# Patient Record
Sex: Male | Born: 1955
Health system: Southern US, Community
[De-identification: ages and names within clinical notes are randomized; demographics above are authoritative.]

---

## 2019-06-05 ENCOUNTER — Other Ambulatory Visit (HOSPITAL_COMMUNITY): Payer: Self-pay

## 2019-06-05 ENCOUNTER — Inpatient Hospital Stay
Admission: RE | Admit: 2019-06-05 | Discharge: 2019-06-17 | Disposition: A | Discharge status: Rehabilitation Facility | Payer: Self-pay | Source: Other Acute Inpatient Hospital | Attending: Internal Medicine | Admitting: Internal Medicine

## 2019-06-05 DIAGNOSIS — K9423 Gastrostomy malfunction: Secondary | ICD-10-CM

## 2019-06-05 DIAGNOSIS — Z931 Gastrostomy status: Secondary | ICD-10-CM

## 2019-06-05 MED ORDER — IOHEXOL 300 MG/ML  SOLN
50.0000 mL | Freq: Once | INTRAMUSCULAR | Status: AC | PRN
  Administered 2019-06-05: 50 mL via ORAL

## 2019-06-06 LAB — COMPREHENSIVE METABOLIC PANEL
ALT: 83 U/L — ABNORMAL HIGH (ref 0–44)
AST: 84 U/L — ABNORMAL HIGH (ref 15–41)
Albumin: 2.1 g/dL — ABNORMAL LOW (ref 3.5–5.0)
Alkaline Phosphatase: 173 U/L — ABNORMAL HIGH (ref 38–126)
Anion gap: 7 (ref 5–15)
BUN: 34 mg/dL — ABNORMAL HIGH (ref 8–23)
CO2: 29 mmol/L (ref 22–32)
Calcium: 8.4 mg/dL — ABNORMAL LOW (ref 8.9–10.3)
Chloride: 109 mmol/L (ref 98–111)
Creatinine, Ser: 2.08 mg/dL — ABNORMAL HIGH (ref 0.61–1.24)
GFR calc Af Amer: 38 mL/min — ABNORMAL LOW (ref >60)
GFR calc non Af Amer: 32 mL/min — ABNORMAL LOW (ref >60)
Glucose, Bld: 138 mg/dL — ABNORMAL HIGH (ref 70–99)
Potassium: 4.5 mmol/L (ref 3.5–5.1)
Sodium: 145 mmol/L (ref 135–145)
Total Bilirubin: 0.6 mg/dL (ref 0.3–1.2)
Total Protein: 6.1 g/dL — ABNORMAL LOW (ref 6.5–8.1)

## 2019-06-06 LAB — CBC
HCT: 39.1 % (ref 39.0–52.0)
Hemoglobin: 12.2 g/dL — ABNORMAL LOW (ref 13.0–17.0)
MCH: 28.6 pg (ref 26.0–34.0)
MCHC: 31.2 g/dL (ref 30.0–36.0)
MCV: 91.8 fL (ref 80.0–100.0)
Platelets: 173 10*3/uL (ref 150–400)
RBC: 4.26 MIL/uL (ref 4.22–5.81)
RDW: 14 % (ref 11.5–15.5)
WBC: 9.3 10*3/uL (ref 4.0–10.5)
nRBC: 0 % (ref 0.0–0.2)

## 2019-06-06 LAB — MAGNESIUM: Magnesium: 2.5 mg/dL — ABNORMAL HIGH (ref 1.7–2.4)

## 2019-06-06 MED ORDER — ALBA-3 0.02-0.1-1 % OT LIQD
OTIC | Status: DC
Start: ? — End: 2019-06-06

## 2019-06-06 MED ORDER — GENERIC EXTERNAL MEDICATION
5.00 | Status: DC
Start: ? — End: 2019-06-06

## 2019-06-06 MED ORDER — BISACODYL 10 MG RE SUPP
10.00 | RECTAL | Status: DC
Start: ? — End: 2019-06-06

## 2019-06-06 MED ORDER — MELATONIN 3 MG PO TABS
6.00 | ORAL_TABLET | ORAL | Status: DC
Start: 2019-06-05 — End: 2019-06-06

## 2019-06-06 MED ORDER — GENERIC EXTERNAL MEDICATION
Status: DC
Start: 2019-06-05 — End: 2019-06-06

## 2019-06-06 MED ORDER — GENERIC EXTERNAL MEDICATION
137.00 | Status: DC
Start: 2019-06-06 — End: 2019-06-06

## 2019-06-06 MED ORDER — GENERIC EXTERNAL MEDICATION
30.00 | Status: DC
Start: 2019-06-05 — End: 2019-06-06

## 2019-06-06 MED ORDER — HYDROMORPHONE HCL 1 MG/ML IJ SOLN
1.00 | INTRAMUSCULAR | Status: DC
Start: ? — End: 2019-06-06

## 2019-06-06 MED ORDER — ALLOPURINOL 300 MG PO TABS
300.00 | ORAL_TABLET | ORAL | Status: DC
Start: 2019-06-06 — End: 2019-06-06

## 2019-06-06 MED ORDER — AMLODIPINE BESYLATE 5 MG PO TABS
5.00 | ORAL_TABLET | ORAL | Status: DC
Start: 2019-06-06 — End: 2019-06-06

## 2019-06-06 MED ORDER — ALBUTEROL SULFATE (2.5 MG/3ML) 0.083% IN NEBU
2.50 | INHALATION_SOLUTION | RESPIRATORY_TRACT | Status: DC
Start: ? — End: 2019-06-06

## 2019-06-06 MED ORDER — Medication
600.00 | Status: DC
Start: ? — End: 2019-06-06

## 2019-06-06 MED ORDER — SENNOSIDES-DOCUSATE SODIUM 8.6-50 MG PO TABS
1.00 | ORAL_TABLET | ORAL | Status: DC
Start: 2019-06-05 — End: 2019-06-06

## 2019-06-06 MED ORDER — INFANTS PANADOL PO
5.00 | ORAL | Status: DC
Start: ? — End: 2019-06-06

## 2019-06-06 MED ORDER — METOPROLOL TARTRATE 50 MG PO TABS
50.00 | ORAL_TABLET | ORAL | Status: DC
Start: 2019-06-05 — End: 2019-06-06

## 2019-06-06 MED ORDER — HEPARIN SODIUM (PORCINE) 5000 UNIT/ML IJ SOLN
5000.00 | INTRAMUSCULAR | Status: DC
Start: 2019-06-05 — End: 2019-06-06

## 2019-06-06 MED ORDER — YALE REUSABLE NEEDLE 25G X 1" MISC
50.00 | Status: DC
Start: 2019-06-05 — End: 2019-06-06

## 2019-06-06 MED ORDER — EQL PEDIATRIC ELECTROLYTE PO SOLN
0.40 | ORAL | Status: DC
Start: 2019-06-06 — End: 2019-06-06

## 2019-06-06 MED ORDER — HYDROXYZINE HCL 25 MG PO TABS
25.00 | ORAL_TABLET | ORAL | Status: DC
Start: ? — End: 2019-06-06

## 2019-06-06 MED ORDER — CALCIUM-MAGNESIUM-VITAMIN D PO
0.50 | ORAL | Status: DC
Start: ? — End: 2019-06-06

## 2019-06-06 MED ORDER — LOSARTAN POTASSIUM 25 MG PO TABS
25.00 | ORAL_TABLET | ORAL | Status: DC
Start: 2019-06-06 — End: 2019-06-06

## 2019-06-06 MED ORDER — PHENYLEPHRINE-GUAIFENESIN 20-375 MG PO CP12
10.00 | ORAL_CAPSULE | ORAL | Status: DC
Start: ? — End: 2019-06-06

## 2019-06-06 MED ORDER — POLYETHYLENE GLYCOL 3350 17 G PO PACK
17.00 | PACK | ORAL | Status: DC
Start: 2019-06-06 — End: 2019-06-06

## 2019-06-06 MED ORDER — QUINERVA 260 MG PO TABS
650.00 | ORAL_TABLET | ORAL | Status: DC
Start: ? — End: 2019-06-06

## 2019-06-06 MED ORDER — PRO HERBS ENERGY PO TABS
20.00 | ORAL_TABLET | ORAL | Status: DC
Start: ? — End: 2019-06-06

## 2019-06-06 MED ORDER — ATORVASTATIN CALCIUM 40 MG PO TABS
80.00 | ORAL_TABLET | ORAL | Status: DC
Start: 2019-06-06 — End: 2019-06-06

## 2019-06-06 MED ORDER — CYSTEINE POWD
25.00 | Status: DC
Start: 2019-06-05 — End: 2019-06-06

## 2019-06-06 MED ORDER — ASPIRIN 81 MG PO CHEW
81.00 | CHEWABLE_TABLET | ORAL | Status: DC
Start: 2019-06-06 — End: 2019-06-06

## 2019-06-06 MED ORDER — ONDANSETRON 4 MG PO TBDP
4.00 | ORAL_TABLET | ORAL | Status: DC
Start: ? — End: 2019-06-06

## 2019-06-07 LAB — URINALYSIS, ROUTINE W REFLEX MICROSCOPIC
Bilirubin Urine: NEGATIVE
Glucose, UA: NEGATIVE mg/dL
Ketones, ur: NEGATIVE mg/dL
Leukocytes,Ua: NEGATIVE
Nitrite: NEGATIVE
Protein, ur: NEGATIVE mg/dL
Specific Gravity, Urine: 1.017 (ref 1.005–1.030)
pH: 5 (ref 5.0–8.0)

## 2019-06-07 LAB — CBC
HCT: 39.5 % (ref 39.0–52.0)
Hemoglobin: 12.2 g/dL — ABNORMAL LOW (ref 13.0–17.0)
MCH: 28.6 pg (ref 26.0–34.0)
MCHC: 30.9 g/dL (ref 30.0–36.0)
MCV: 92.7 fL (ref 80.0–100.0)
Platelets: 187 10*3/uL (ref 150–400)
RBC: 4.26 MIL/uL (ref 4.22–5.81)
RDW: 14.1 % (ref 11.5–15.5)
WBC: 10.7 10*3/uL — ABNORMAL HIGH (ref 4.0–10.5)
nRBC: 0 % (ref 0.0–0.2)

## 2019-06-07 LAB — RENAL FUNCTION PANEL
Albumin: 2.2 g/dL — ABNORMAL LOW (ref 3.5–5.0)
Anion gap: 9 (ref 5–15)
BUN: 47 mg/dL — ABNORMAL HIGH (ref 8–23)
CO2: 28 mmol/L (ref 22–32)
Calcium: 8.4 mg/dL — ABNORMAL LOW (ref 8.9–10.3)
Chloride: 108 mmol/L (ref 98–111)
Creatinine, Ser: 2.16 mg/dL — ABNORMAL HIGH (ref 0.61–1.24)
GFR calc Af Amer: 36 mL/min — ABNORMAL LOW (ref >60)
GFR calc non Af Amer: 31 mL/min — ABNORMAL LOW (ref >60)
Glucose, Bld: 142 mg/dL — ABNORMAL HIGH (ref 70–99)
Phosphorus: 2.5 mg/dL (ref 2.5–4.6)
Potassium: 4.1 mmol/L (ref 3.5–5.1)
Sodium: 145 mmol/L (ref 135–145)

## 2019-06-07 LAB — MAGNESIUM: Magnesium: 2.6 mg/dL — ABNORMAL HIGH (ref 1.7–2.4)

## 2019-06-08 LAB — URINE CULTURE: Culture: <10000 — AB

## 2019-06-09 LAB — RENAL FUNCTION PANEL
Albumin: 2.3 g/dL — ABNORMAL LOW (ref 3.5–5.0)
Anion gap: 9 (ref 5–15)
BUN: 46 mg/dL — ABNORMAL HIGH (ref 8–23)
CO2: 30 mmol/L (ref 22–32)
Calcium: 8.8 mg/dL — ABNORMAL LOW (ref 8.9–10.3)
Chloride: 107 mmol/L (ref 98–111)
Creatinine, Ser: 1.76 mg/dL — ABNORMAL HIGH (ref 0.61–1.24)
GFR calc Af Amer: 46 mL/min — ABNORMAL LOW (ref >60)
GFR calc non Af Amer: 40 mL/min — ABNORMAL LOW (ref >60)
Glucose, Bld: 118 mg/dL — ABNORMAL HIGH (ref 70–99)
Phosphorus: 3.4 mg/dL (ref 2.5–4.6)
Potassium: 4.3 mmol/L (ref 3.5–5.1)
Sodium: 146 mmol/L — ABNORMAL HIGH (ref 135–145)

## 2019-06-09 LAB — CBC
HCT: 40.6 % (ref 39.0–52.0)
Hemoglobin: 12.4 g/dL — ABNORMAL LOW (ref 13.0–17.0)
MCH: 28.4 pg (ref 26.0–34.0)
MCHC: 30.5 g/dL (ref 30.0–36.0)
MCV: 93.1 fL (ref 80.0–100.0)
Platelets: 224 10*3/uL (ref 150–400)
RBC: 4.36 MIL/uL (ref 4.22–5.81)
RDW: 14 % (ref 11.5–15.5)
WBC: 10 10*3/uL (ref 4.0–10.5)
nRBC: 0 % (ref 0.0–0.2)

## 2019-06-09 LAB — MAGNESIUM: Magnesium: 2.5 mg/dL — ABNORMAL HIGH (ref 1.7–2.4)

## 2019-06-11 ENCOUNTER — Other Ambulatory Visit (HOSPITAL_COMMUNITY): Payer: Self-pay

## 2019-06-11 LAB — BASIC METABOLIC PANEL
Anion gap: 10 (ref 5–15)
BUN: 37 mg/dL — ABNORMAL HIGH (ref 8–23)
CO2: 29 mmol/L (ref 22–32)
Calcium: 8.8 mg/dL — ABNORMAL LOW (ref 8.9–10.3)
Chloride: 102 mmol/L (ref 98–111)
Creatinine, Ser: 1.57 mg/dL — ABNORMAL HIGH (ref 0.61–1.24)
GFR calc Af Amer: 53 mL/min — ABNORMAL LOW (ref >60)
GFR calc non Af Amer: 46 mL/min — ABNORMAL LOW (ref >60)
Glucose, Bld: 136 mg/dL — ABNORMAL HIGH (ref 70–99)
Potassium: 5.3 mmol/L — ABNORMAL HIGH (ref 3.5–5.1)
Sodium: 141 mmol/L (ref 135–145)

## 2019-06-11 MED ORDER — IOHEXOL 300 MG/ML  SOLN: 50.0000 mL | Freq: Once | INTRAMUSCULAR | Status: DC | PRN

## 2019-06-12 LAB — POTASSIUM: Potassium: 3.9 mmol/L (ref 3.5–5.1)

## 2019-06-13 ENCOUNTER — Encounter (HOSPITAL_COMMUNITY): Payer: Self-pay | Admitting: Diagnostic Radiology

## 2019-06-13 ENCOUNTER — Other Ambulatory Visit (HOSPITAL_COMMUNITY): Payer: Self-pay

## 2019-06-13 HISTORY — PX: IR REPLC GASTRO/COLONIC TUBE PERCUT W/FLUORO: IMG2333

## 2019-06-13 LAB — BASIC METABOLIC PANEL
Anion gap: 11 (ref 5–15)
BUN: 46 mg/dL — ABNORMAL HIGH (ref 8–23)
CO2: 30 mmol/L (ref 22–32)
Calcium: 9 mg/dL (ref 8.9–10.3)
Chloride: 96 mmol/L — ABNORMAL LOW (ref 98–111)
Creatinine, Ser: 1.24 mg/dL (ref 0.61–1.24)
GFR calc Af Amer: >60 mL/min (ref >60)
GFR calc non Af Amer: >60 mL/min (ref >60)
Glucose, Bld: 116 mg/dL — ABNORMAL HIGH (ref 70–99)
Potassium: 3.1 mmol/L — ABNORMAL LOW (ref 3.5–5.1)
Sodium: 137 mmol/L (ref 135–145)

## 2019-06-13 LAB — CBC
HCT: 23.3 % — ABNORMAL LOW (ref 39.0–52.0)
Hemoglobin: 8.2 g/dL — ABNORMAL LOW (ref 13.0–17.0)
MCH: 29.9 pg (ref 26.0–34.0)
MCHC: 35.2 g/dL (ref 30.0–36.0)
MCV: 85 fL (ref 80.0–100.0)
Platelets: 286 10*3/uL (ref 150–400)
RBC: 2.74 MIL/uL — ABNORMAL LOW (ref 4.22–5.81)
RDW: 13.7 % (ref 11.5–15.5)
WBC: 8.4 10*3/uL (ref 4.0–10.5)
nRBC: 0.2 % (ref 0.0–0.2)

## 2019-06-13 LAB — MAGNESIUM: Magnesium: 1.7 mg/dL (ref 1.7–2.4)

## 2019-06-13 MED ORDER — IOHEXOL 300 MG/ML  SOLN
50.0000 mL | Freq: Once | INTRAMUSCULAR | Status: AC | PRN
  Administered 2019-06-13: 11:00:00 10 mL via INTRAVENOUS

## 2019-06-13 MED ORDER — LIDOCAINE VISCOUS HCL 2 % MT SOLN
OROMUCOSAL | Status: AC | PRN
  Administered 2019-06-13: 15 mL via OROMUCOSAL

## 2019-06-13 MED ORDER — LIDOCAINE VISCOUS HCL 2 % MT SOLN
OROMUCOSAL | Status: AC
  Filled 2019-06-13: qty 15

## 2019-06-13 NOTE — Procedures (Signed)
Interventional Radiology Procedure:   Indications: Dislodged gastrostomy tube  Procedure: Gastrostomy tube exchange  Findings: New 18 Fr tube in stomach  Complications: None     EBL: None  Plan: Gastrostomy tube is ready to use.     Merranda Bolls R. Anselm Pancoast, MD  Pager: [phone]

## 2019-06-14 LAB — MAGNESIUM: Magnesium: 2.4 mg/dL (ref 1.7–2.4)

## 2019-06-14 LAB — POTASSIUM: Potassium: 4.3 mmol/L (ref 3.5–5.1)

## 2019-06-16 LAB — BASIC METABOLIC PANEL
Anion gap: 7 (ref 5–15)
BUN: 24 mg/dL — ABNORMAL HIGH (ref 8–23)
CO2: 33 mmol/L — ABNORMAL HIGH (ref 22–32)
Calcium: 8.5 mg/dL — ABNORMAL LOW (ref 8.9–10.3)
Chloride: 100 mmol/L (ref 98–111)
Creatinine, Ser: 1.53 mg/dL — ABNORMAL HIGH (ref 0.61–1.24)
GFR calc Af Amer: 54 mL/min — ABNORMAL LOW (ref >60)
GFR calc non Af Amer: 47 mL/min — ABNORMAL LOW (ref >60)
Glucose, Bld: 136 mg/dL — ABNORMAL HIGH (ref 70–99)
Potassium: 5.1 mmol/L (ref 3.5–5.1)
Sodium: 140 mmol/L (ref 135–145)

## 2019-06-16 LAB — CBC
HCT: 40.7 % (ref 39.0–52.0)
Hemoglobin: 12.7 g/dL — ABNORMAL LOW (ref 13.0–17.0)
MCH: 28.5 pg (ref 26.0–34.0)
MCHC: 31.2 g/dL (ref 30.0–36.0)
MCV: 91.5 fL (ref 80.0–100.0)
Platelets: 226 10*3/uL (ref 150–400)
RBC: 4.45 MIL/uL (ref 4.22–5.81)
RDW: 13.9 % (ref 11.5–15.5)
WBC: 8.6 10*3/uL (ref 4.0–10.5)
nRBC: 0 % (ref 0.0–0.2)

## 2019-06-16 LAB — PHOSPHORUS: Phosphorus: 2.6 mg/dL (ref 2.5–4.6)

## 2019-06-16 LAB — MAGNESIUM: Magnesium: 2.1 mg/dL (ref 1.7–2.4)

## 2019-06-17 LAB — NOVEL CORONAVIRUS, NAA (HOSP ORDER, SEND-OUT TO REF LAB; TAT 18-24 HRS): SARS-CoV-2, NAA: NOT DETECTED

## 2019-10-04 IMAGING — XA GASTROSTOMY CATHETER REPLACEMENT
2 series · 2 of 2 positions shown · non-contrast
Comparison: none

INDICATION: 63-year-old with history of a surgical gastrostomy tube that was
dislodged. Foley catheter was temporarily placed to keep
percutaneous access.

[Series 3: fl (-) angio · 1 of 1 slices shown (1 of 2)]
[im 1/1]
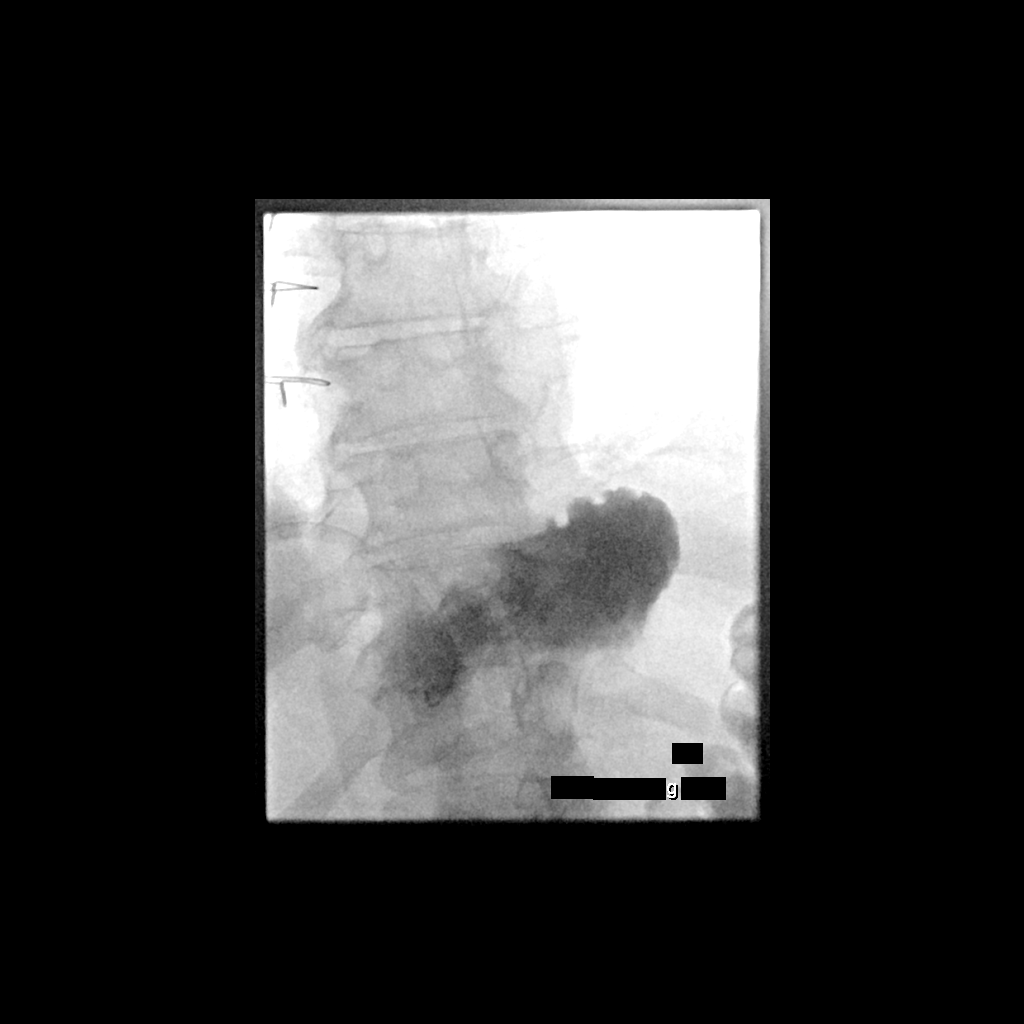

[Series 4: fl (-) angio · 1 of 1 slices shown (2 of 2)]
[im 1/1]
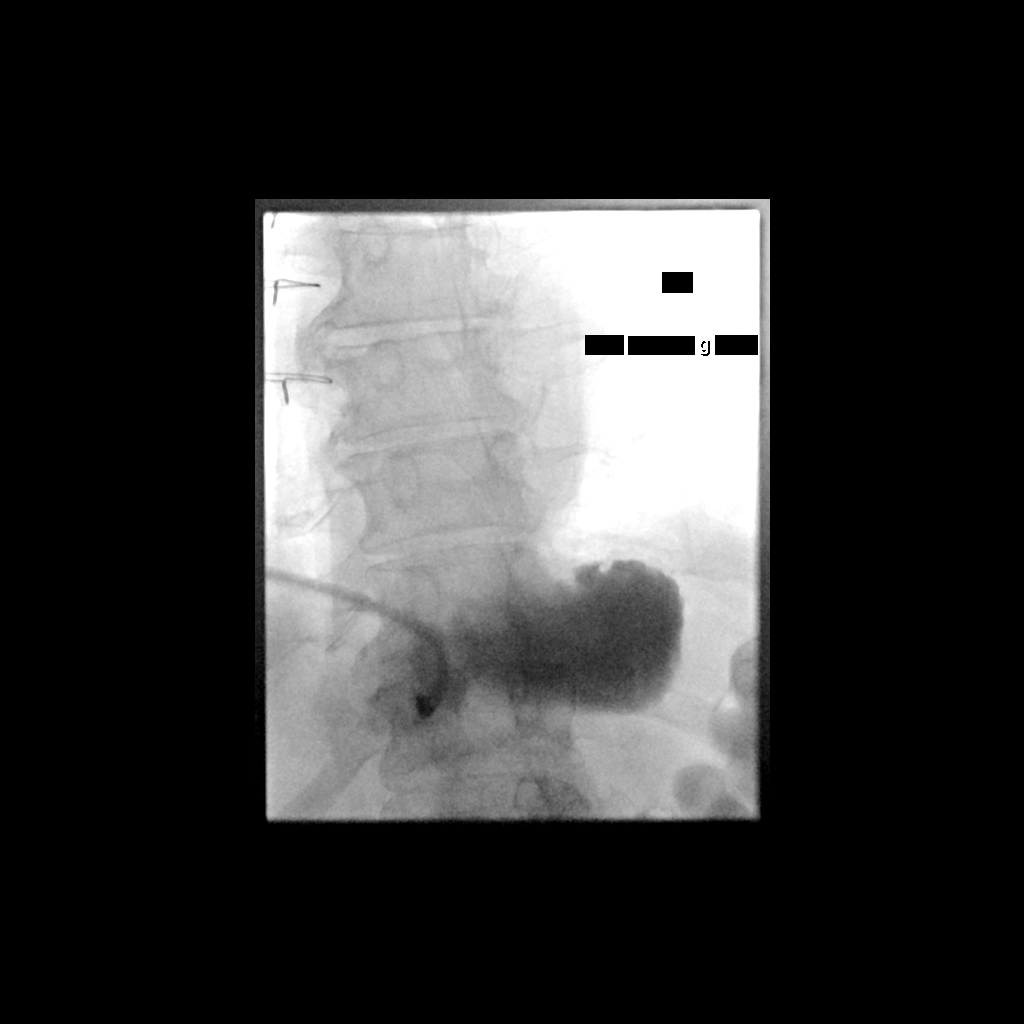

[2 of 2 positions shown; findings below may reference images not displayed]

EXAM:
GASTROSTOMY TUBE EXCHANGE WITH FLUOROSCOPY

MEDICATIONS:
None

ANESTHESIA/SEDATION:
None

CONTRAST:  10 mL Omnipaque 300-administered into the gastric lumen.

FLUOROSCOPY TIME:  Fluoroscopy Time: 54 seconds, 32 mGy

COMPLICATIONS:
None immediate.

PROCEDURE:
Informed written consent was obtained from the patient after a
thorough discussion of the procedural risks, benefits and
alternatives. All questions were addressed. Maximal Sterile Barrier
Technique was utilized including caps, mask, sterile gowns, sterile
gloves, sterile drape, hand hygiene and skin antiseptic. A timeout
was performed prior to the initiation of the procedure.

The existing catheter was prepped and draped in a sterile fashion.
Contrast injection confirmed the Foley catheter was in the stomach.
The retention sutures were removed. The Foley catheter balloon was
deflated. Stiff Amplatz wire was advanced through the catheter and
into the stomach with fluoroscopic guidance. The tube was removed
over the wire. A new 18 French balloon retention gastrostomy tube
was easily advanced over the wire. The balloon was inflated with
saline. Contrast injection confirmed placement in the stomach.
IMPRESSION: Successful exchange of the gastrostomy tube with fluoroscopy.
Patient currently has an 18 French balloon retention gastrostomy
tube. Tube is ready to use.

## 2021-06-24 DEATH — deceased
